# Patient Record
Sex: Female | Born: 1975 | Hispanic: Yes | Marital: Married | Smoking: Never smoker
Health system: Southern US, Community
[De-identification: ages and names within clinical notes are randomized; demographics above are authoritative.]

---

## 2014-06-18 ENCOUNTER — Ambulatory Visit: Payer: Self-pay | Admitting: Family Medicine

## 2014-06-18 LAB — URINALYSIS, COMPLETE
BILIRUBIN, UR: NEGATIVE
BLOOD: NEGATIVE
GLUCOSE, UR: NEGATIVE
Ketone: NEGATIVE
LEUKOCYTE ESTERASE: NEGATIVE
NITRITE: NEGATIVE
PROTEIN: NEGATIVE
Ph: 5.5 (ref 5.0–8.0)
RBC, UR: NONE SEEN /HPF (ref 0–5)
Specific Gravity: 1.025 (ref 1.000–1.030)

## 2014-06-20 LAB — URINE CULTURE

## 2019-03-23 ENCOUNTER — Ambulatory Visit: Admission: EM | Admit: 2019-03-23 | Discharge: 2019-03-23 | Discharge status: Against Medical Advice | Payer: Self-pay

## 2019-03-23 ENCOUNTER — Emergency Department: Payer: Self-pay

## 2019-03-23 ENCOUNTER — Other Ambulatory Visit: Payer: Self-pay

## 2019-03-23 ENCOUNTER — Emergency Department
Admission: EM | Admit: 2019-03-23 | Discharge: 2019-03-23 | Disposition: A | Discharge status: Home / Self Care | Payer: Self-pay | Attending: Emergency Medicine | Admitting: Emergency Medicine

## 2019-03-23 DIAGNOSIS — O200 Threatened abortion: Secondary | ICD-10-CM | POA: Insufficient documentation

## 2019-03-23 DIAGNOSIS — O209 Hemorrhage in early pregnancy, unspecified: Secondary | ICD-10-CM

## 2019-03-23 DIAGNOSIS — Z3491 Encounter for supervision of normal pregnancy, unspecified, first trimester: Secondary | ICD-10-CM

## 2019-03-23 LAB — ABO/RH: ABO/RH(D): B POS

## 2019-03-23 LAB — POCT PREGNANCY, URINE: Preg Test, Ur: POSITIVE — AB

## 2019-03-23 LAB — HCG, QUANTITATIVE, PREGNANCY: hCG, Beta Chain, Quant, S: 153 m[IU]/mL — ABNORMAL HIGH (ref <5)

## 2019-03-23 NOTE — ED Provider Notes (Signed)
[facility] Emergency Department Provider Note  ____________________________________________   First MD Initiated Contact with Patient 03/23/19 1754     (approximate)  I have reviewed the triage vital signs and the nursing notes.   HISTORY  Chief Complaint Vaginal Bleeding    HPI Cassie Adams is a 43 y.o. female presents emergency department with complaints of vaginal bleeding and cramping.  She states she saw a clot in the toilet this morning.  Had a positive pregnancy test and beta hCG was 436 last week.  She thinks she is approximately [redacted] weeks pregnant.  However states she was on birth control when she got pregnant.  Patient has 2 other children.  She denies any abdominal pain or pelvic pain.  States she does have vaginal bleeding that is light brown in color.    History reviewed. No pertinent past medical history.  There are no active problems to display for this patient.   History reviewed. No pertinent surgical history.  Prior to Admission medications   Not on File    Allergies Patient has no known allergies.  No family history on file.  Social History Social History   Tobacco Use  . Smoking status: Never Smoker  . Smokeless tobacco: Never Used  Substance Use Topics  . Alcohol use: Never    Frequency: Never  . Drug use: Never    Review of Systems  Constitutional: No fever/chills Eyes: No visual changes. ENT: No sore throat. Respiratory: Denies cough Genitourinary: Negative for dysuria.  Positive vaginal bleeding Musculoskeletal: Negative for back pain. Skin: Negative for rash.    ____________________________________________   PHYSICAL EXAM:  VITAL SIGNS: ED Triage Vitals  Enc Vitals Group     BP 03/23/19 1355 (!) 165/94     Pulse Rate 03/23/19 1355 (!) 114     Resp 03/23/19 1355 18     Temp 03/23/19 1355 98.6 F (37 C)     Temp Source 03/23/19 1355 Oral     SpO2 03/23/19 1355 100 %     Weight  03/23/19 1356 149 lb (67.6 kg)     Height 03/23/19 1356 5\' 2"  (1.575 m)     Head Circumference --      Peak Flow --      Pain Score 03/23/19 1355 0     Pain Loc --      Pain Edu? --      Excl. in GC? --     Constitutional: Alert and oriented. Well appearing and in no acute distress. Eyes: Conjunctivae are normal.  Head: Atraumatic. Nose: No congestion/rhinnorhea. Mouth/Throat: Mucous membranes are moist.   Neck:  supple no lymphadenopathy noted Cardiovascular: Normal rate, regular rhythm.  Respiratory: Normal respiratory effort.  No retractions Abd: soft nontender bs normal all 4 quad GU: deferred Musculoskeletal: FROM all extremities, warm and well perfused Neurologic:  Normal speech and language.  Skin:  Skin is warm, dry and intact. No rash noted. Psychiatric: Mood and affect are normal. Speech and behavior are normal.  ____________________________________________   LABS (all labs ordered are listed, but only abnormal results are displayed)  Labs Reviewed  HCG, QUANTITATIVE, PREGNANCY - Abnormal; Notable for the following components:      Result Value   hCG, Beta Chain, Quant, S 153 (*)    All other components within normal limits  POCT PREGNANCY, URINE - Abnormal; Notable for the following components:   Preg Test, Ur POSITIVE (*)    All other components within normal limits  POC URINE PREG, ED  ABO/RH   ____________________________________________   ____________________________________________  RADIOLOGY  Ultrasound does not show an IUP.  Radiologist recommends serial hCGs  ____________________________________________   PROCEDURES  Procedure(s) performed: No  Procedures    ____________________________________________   INITIAL IMPRESSION / ASSESSMENT AND PLAN / ED COURSE  Pertinent labs & imaging results that were available during my care of the patient were reviewed by me and considered in my medical decision making (see chart for details).    Patient's 43 year old female presents emergency department with complaints of vaginal bleeding and thinks she has approximately [redacted] weeks pregnant.  Her last beta hCG which was performed at her office in [place] was 436.  She denies any abdominal pain or pelvic pain.  She states she has had some light cramping and saw blood clot in the toilet.  Physical exam patient appears well.  Abdomen/pelvis area is nontender.  Remainder the exam is unremarkable  POC pregnancy is positive ABO/Rh is B+ Beta hCG quant is 153 Ultrasound for OB less than 14 weeks does not show an IUP.  Radiologist recommends serial hCGs  All results were discussed with the patient.  Explained her that since she had a beta hCG is 436 and this 1 is 153 she should have a repeat in 3 to 7 days.  She should follow-up with GYN.  Referral was given to her for Dr. Glennon Mac.  She states she understands will comply.  She is to return to the emergency department if having abdominal pain.  She was discharged in stable condition.    Cassie Adams was evaluated in Emergency Department on 03/23/2019 for the symptoms described in the history of present illness. She was evaluated in the context of the global COVID-19 pandemic, which necessitated consideration that the patient might be at risk for infection with the SARS-CoV-2 virus that causes COVID-19. Institutional protocols and algorithms that pertain to the evaluation of patients at risk for COVID-19 are in a state of rapid change based on information released by regulatory bodies including the CDC and federal and state organizations. These policies and algorithms were followed during the patient's care in the ED.   As part of my medical decision making, I reviewed the following data within the [facility] notes reviewed and incorporated, Labs reviewed see above, Old chart reviewed, Radiograph reviewed ultrasound negative for IUP, Notes from prior ED visits and [place]  Controlled Substance Database  ____________________________________________   FINAL CLINICAL IMPRESSION(S) / ED DIAGNOSES  Final diagnoses:  Threatened miscarriage      NEW MEDICATIONS STARTED DURING THIS VISIT:  New Prescriptions   No medications on file     Note:  This document was prepared using Dragon voice recognition software and may include unintentional dictation errors.    Versie Starks, PA-C 03/23/19 1816    Blake Divine, MD 03/23/19 [phone]

## 2019-03-23 NOTE — ED Triage Notes (Signed)
Light vaginal bleeding, brown in color that began today with wiping. States 1 little clot in toilet. reports approx [redacted] week pregnant. Denies pain.

## 2019-03-23 NOTE — ED Notes (Signed)
See triage note  States she developed some vaginal bleeding and some cramping  States she had a positive preg  Thinks she is approx [redacted] weeks pregnant

## 2019-03-23 NOTE — Discharge Instructions (Addendum)
Follow-up with your regular doctor for repeat beta-hCG in 3 days.  Return emergency department if developing abdominal pain.

## 2019-03-29 ENCOUNTER — Ambulatory Visit: Payer: Self-pay | Admitting: Obstetrics and Gynecology

## 2019-03-29 NOTE — Progress Notes (Deleted)
Obstetric Problem Visit    Chief Complaint: No chief complaint on file.   History of Present Illness: Patient is a 43 y.o. G1P0 Unknown presenting for first trimester bleeding.  The onset of bleeding was  03/23/2019 prompting ER presentation.  Is bleeding equal to or greater than normal menstrual flow:  Yes Any recent trauma:  No Recent intercourse:  No History of prior miscarriage:  {yes/no:20286} Prior ultrasound demonstrating IUP: none.  Prior ultrasound demonstrating viable IUP:  No Prior Serum HCG:  Yes 190mIU/mL on 03/23/2019 Rh status: B positive  Review of Systems: ROS  Past Medical History:  No past medical history on file.  Past Surgical History:  No past surgical history on file.  Obstetric History: G1P0  Family History:  No family history on file.  Social History:  Social History   Socioeconomic History  . Marital status: Married    Spouse name: Not on file  . Number of children: Not on file  . Years of education: Not on file  . Highest education level: Not on file  Occupational History  . Not on file  Social Needs  . Financial resource strain: Not on file  . Food insecurity    Worry: Not on file    Inability: Not on file  . Transportation needs    Medical: Not on file    Non-medical: Not on file  Tobacco Use  . Smoking status: Never Smoker  . Smokeless tobacco: Never Used  Substance and Sexual Activity  . Alcohol use: Never    Frequency: Never  . Drug use: Never  . Sexual activity: Not on file  Lifestyle  . Physical activity    Days per week: Not on file    Minutes per session: Not on file  . Stress: Not on file  Relationships  . Social Herbalist on phone: Not on file    Gets together: Not on file    Attends religious service: Not on file    Active member of club or organization: Not on file    Attends meetings of clubs or organizations: Not on file    Relationship status: Not on file  . Intimate partner violence    Fear of  current or ex partner: Not on file    Emotionally abused: Not on file    Physically abused: Not on file    Forced sexual activity: Not on file  Other Topics Concern  . Not on file  Social History Narrative  . Not on file    Allergies:  No Known Allergies  Medications: Prior to Admission medications   Not on File    Physical Exam Vitals: Last menstrual period 01/18/2019. General: NAD HEENT: normocephalic, anicteric Neck: Thyroid non-enlarged, no nodules Pulmonary: No increased work of breathing, Abdomen: NABS, soft, non-tender, non-distended.  Umbilicus without lesions.  No hepatomegaly, splenomegaly or masses palpable. No evidence of hernia  Genitourinary:  External: Normal external female genitalia.  Normal urethral meatus, normal Bartholin's and Skene's glands.    Vagina: Normal vaginal mucosa, no evidence of prolapse.    Cervix: ***  Uterus:***  Adnexa: ovaries non-enlarged, no adnexal masses  Rectal: deferred Extremities: no edema, erythema, or tenderness MSK: *** Neurologic: Grossly intact Psychiatric: mood appropriate, affect full  US Ob Less Than 14 Weeks With Ob Transvaginal  Result Date: 03/23/2019 CLINICAL DATA:  Vaginal bleeding affecting first trimester of pregnancy, passed clot today; current quantitative beta HCG = 153 EXAM: OBSTETRIC <14 WK [place] AND  TRANSVAGINAL OB US TECHNIQUE: Both transabdominal and transvaginal ultrasound examinations were performed for complete evaluation of the gestation as well as the maternal uterus, adnexal regions, and pelvic cul-de-sac. Transvaginal technique was performed to assess early pregnancy. COMPARISON:  None for this gestation FINDINGS: Intrauterine gestational sac: Absent Yolk sac:  N/A Embryo:  N/A Cardiac Activity: N/A Heart Rate: N/A  bpm MSD:   mm    w     d CRL:    mm    w    d                  US EDC: Subchronic hemorrhage: N/A Maternal uterus/adnexae: Anteverted uterus. Heterogeneous myometrium with probable tiny  leiomyomata. Endometrial complex 7 mm thick without fluid or gestational sac. RIGHT ovary normal size and morphology, 2.3 x 1.4 x 1.5 cm. LEFT ovary normal size and morphology, 2.8 x 1.8 x 2.2 cm. IMPRESSION: No intrauterine gestation identified. Findings are compatible with pregnancy of unknown location. Differential diagnosis includes early intrauterine pregnancy too early to visualize, spontaneous abortion, and ectopic pregnancy. Serial quantitative beta hCG and or followup ultrasound recommended to definitively exclude ectopic pregnancy. Electronically Signed   By: Ulyses SouthwardMark  Boles M.D.   On: 03/23/2019 17:11    Assessment: 43 y.o. G1P0 Unknown presenting for evaluation of first trimester vaginal bleeding  Plan: Problem List Items Addressed This Visit    None      1) First trimester bleeding - incidence and clinical course of first trimester bleeding is discussed in detail with the patient today.  Approximately 1/3 of pregnancies ending in live births experienced 1st trimester bleeding.  The amount of bleeding is variable and not necessarily predictive of outcome.  Sources may be cervical or uterine.  Subchorionic hemorrhages are a frequent concurrent findings on ultrasound and are followed expectantly.  These often absorb or regress spontaneously although risk for expansion and further disruption of the utero-placental interface leading to miscarriage is possible.  There is no clearly documented benefit to limiting or modifying activity and sexual intercourse in altering clinic course of 1st trimester bleeding.    2) Trend HCG level to see if rising or falling.  Follow up ultrasound 04/02/2019  3) The patient is Rh postive, rhogam is therefore not indicated to decrease the risk rhesus alloimmunization.    4) Routine bleeding precautions were discussed with the patient prior the conclusion of today's visit.  5) Health maintenance - pap smear today    Vena AustriaAndreas Jaxsen Bernhart, MD, Merlinda FrederickFACOG [facility],  [facility] 03/29/2019, 9:40 AM

## 2020-08-22 IMAGING — US US OB < 14 WEEKS - US OB TV
1 series · 13 of 28 positions shown · non-contrast
Comparison: None for this gestation

CLINICAL DATA: Vaginal bleeding affecting first trimester of
pregnancy, passed clot today; current quantitative beta HCG = 153

EXAM:
OBSTETRIC <14 WK US AND TRANSVAGINAL OB US
TECHNIQUE: Both transabdominal and transvaginal ultrasound examinations were
performed for complete evaluation of the gestation as well as the
maternal uterus, adnexal regions, and pelvic cul-de-sac.
Transvaginal technique was performed to assess early pregnancy.

[Series 1: us ob < 14 weeks - us ob tv · 0.19mm/px · 13 of 73 slices shown]
[im 3/73]
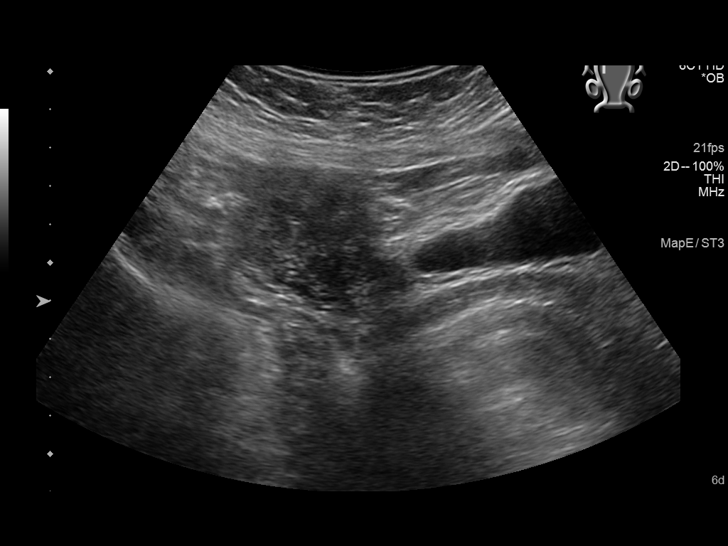
[im 9/73]
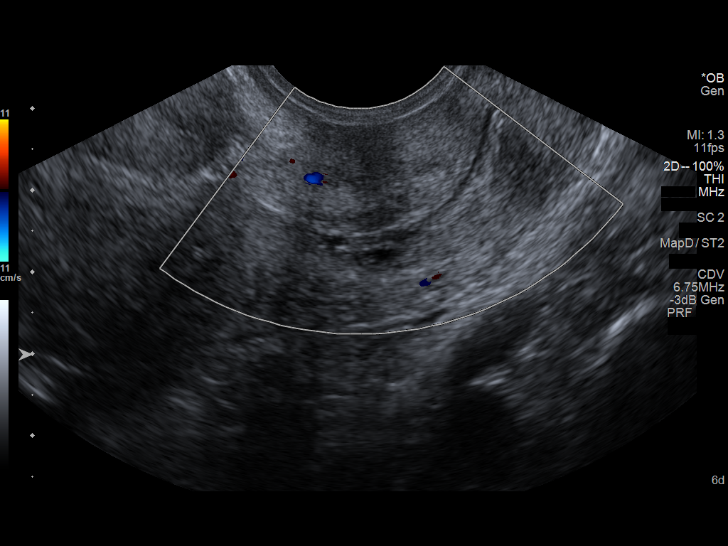
[im 14/73]
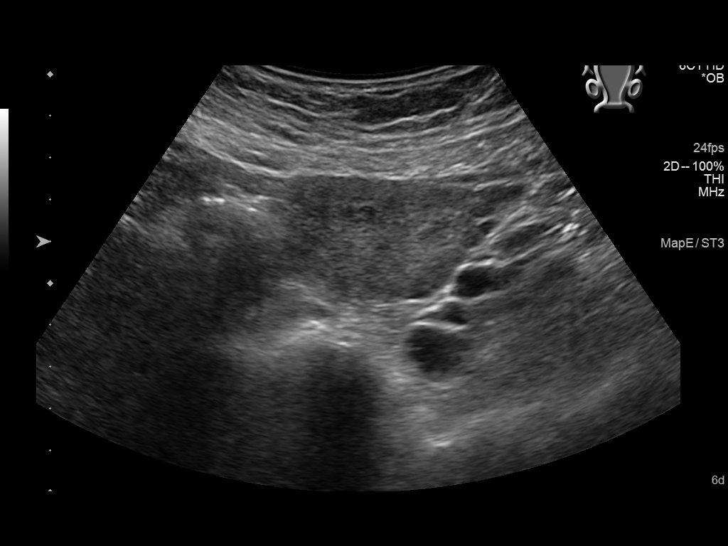
[im 19/73]
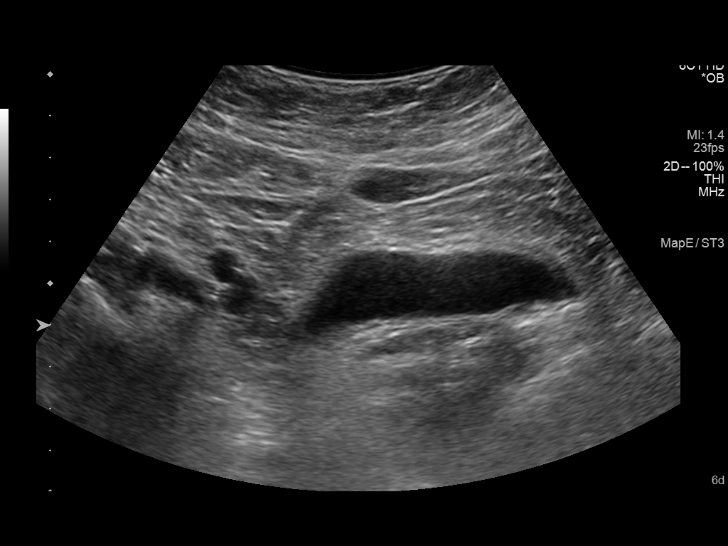
[im 25/73]
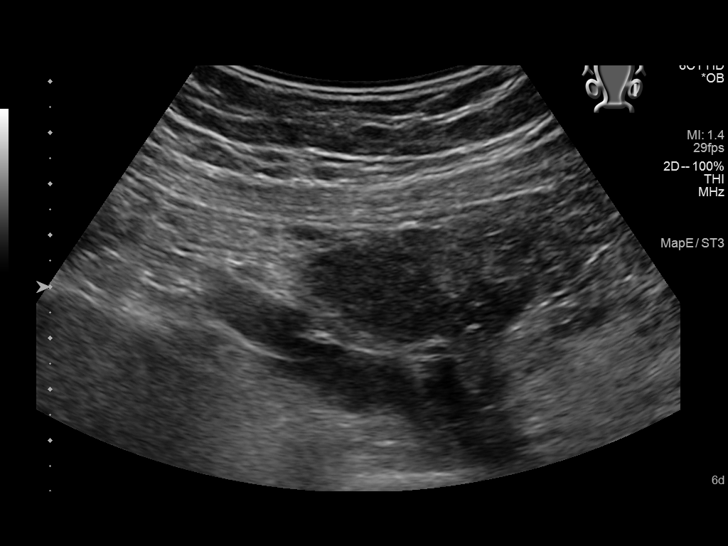
[im 30/73]
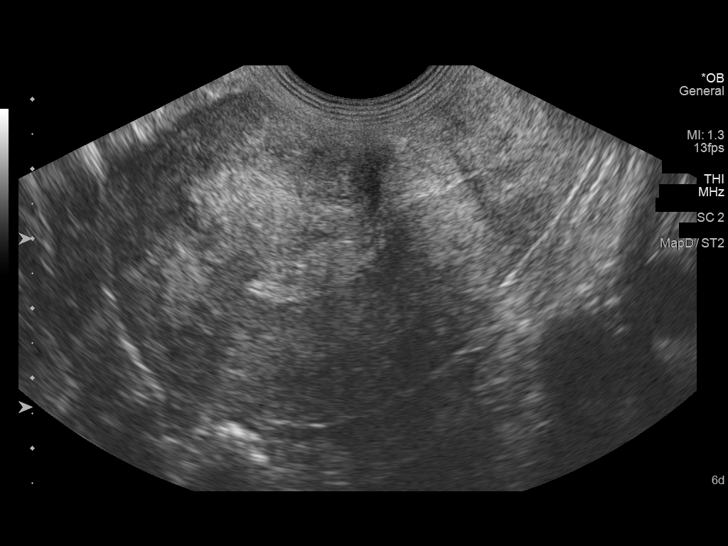
[im 38/73]
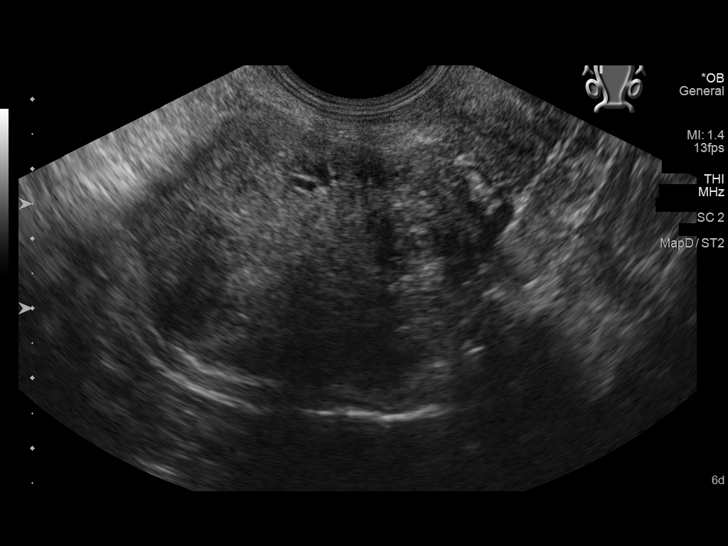
[im 43/73]
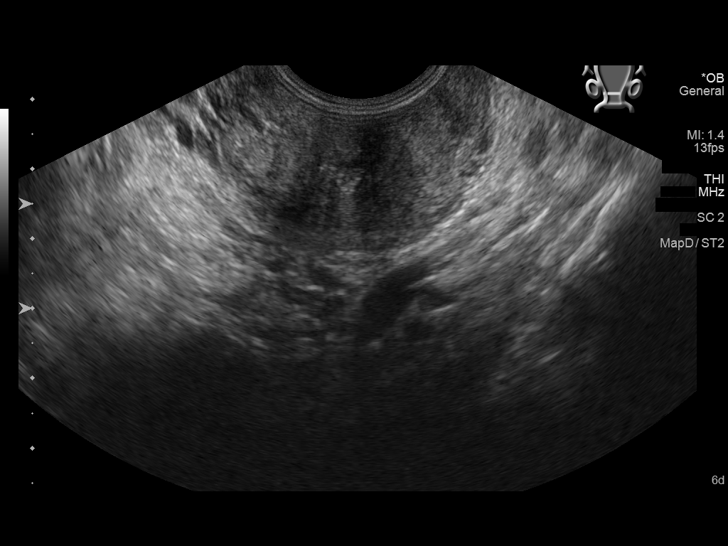
[im 49/73]
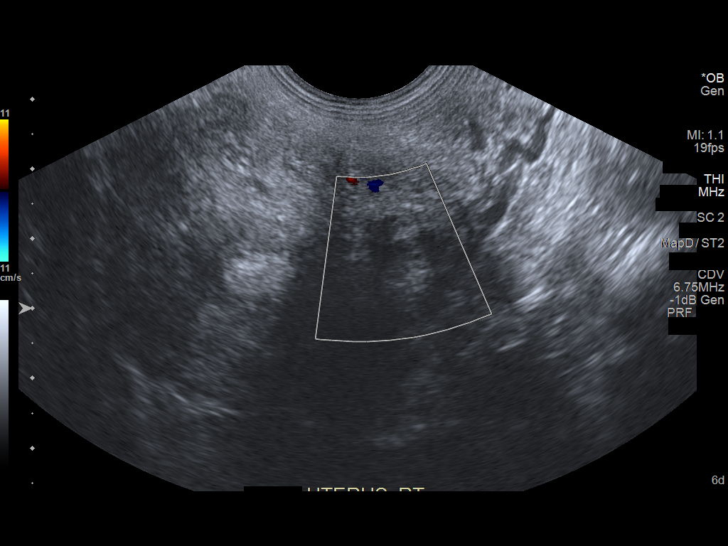
[im 54/73]
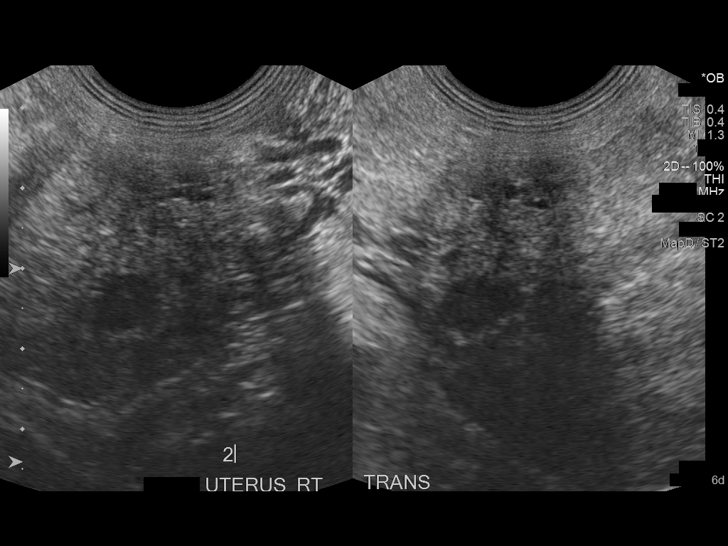
[im 59/73]
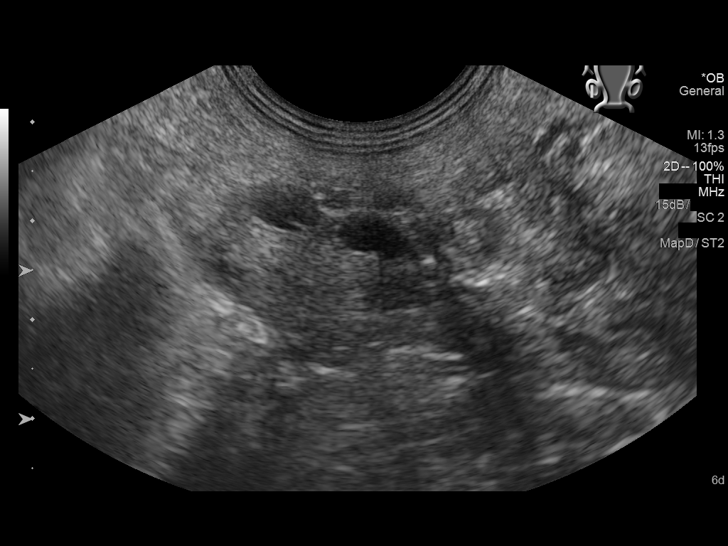
[im 65/73]
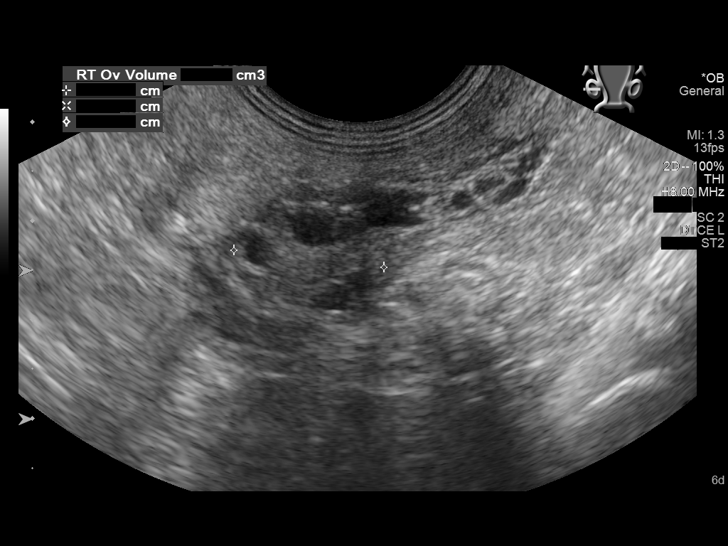
[im 70/73]
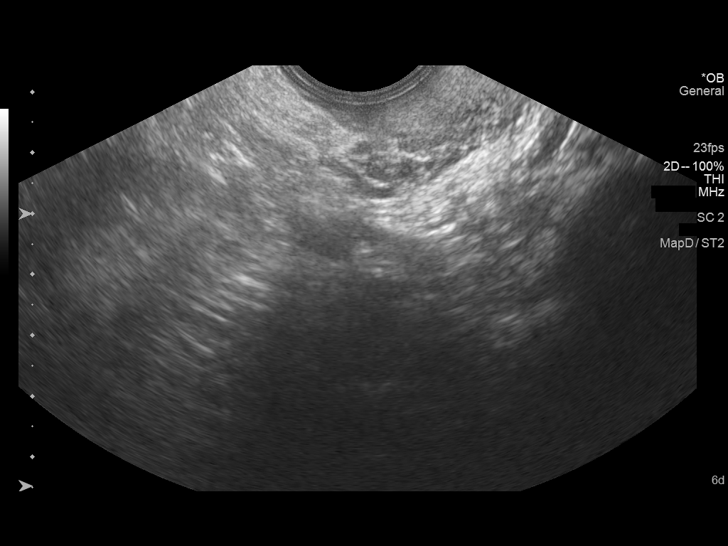

[13 of 28 positions shown; findings below may reference images not displayed]

FINDINGS: Intrauterine gestational sac: Absent

Yolk sac:  N/A

Embryo:  N/A

Cardiac Activity: N/A

Heart Rate: N/A  bpm

MSD:   mm    w     d

CRL:    mm    w    d                  US EDC:

Subchronic hemorrhage: N/A

Maternal uterus/adnexae:

Anteverted uterus.

Heterogeneous myometrium with probable tiny leiomyomata.

Endometrial complex 7 mm thick without fluid or gestational sac.

RIGHT ovary normal size and morphology, 2.3 x 1.4 x 1.5 cm.

LEFT ovary normal size and morphology, 2.8 x 1.8 x 2.2 cm.
IMPRESSION: No intrauterine gestation identified.

Findings are compatible with pregnancy of unknown location.

Differential diagnosis includes early intrauterine pregnancy too
early to visualize, spontaneous abortion, and ectopic pregnancy.

Serial quantitative beta hCG and or followup ultrasound recommended
to definitively exclude ectopic pregnancy.

## 2021-10-09 ENCOUNTER — Ambulatory Visit: Payer: Self-pay | Admitting: Family Medicine

## 2024-01-16 NOTE — Progress Notes (Signed)
 [facility] Quality Team Note  Name: Cassie Adams Date of Birth: 1976/02/16 MRN: [id number] Date: 01/16/2024  [facility] Quality Team has reviewed this patient's chart, please see recommendations below:  [facility] Quality Other; (CHART REVIEWED FOR COLORECTAL CANCER SCREENING, NO RECORD FOUND FOR GAP CLOSURE)
